# Patient Record
Sex: Male | Born: 1980 | Hispanic: No | Marital: Married | Smoking: Former smoker
Health system: Southern US, Community
[De-identification: ages and names within clinical notes are randomized; demographics above are authoritative.]

## PROBLEM LIST (undated history)

## (undated) HISTORY — PX: KNEE CARTILAGE SURGERY: SHX688

---

## 2008-05-29 ENCOUNTER — Encounter: Admission: RE | Admit: 2008-05-29 | Discharge: 2008-05-29 | Payer: Self-pay | Admitting: Internal Medicine

## 2019-05-05 DIAGNOSIS — Z03818 Encounter for observation for suspected exposure to other biological agents ruled out: Secondary | ICD-10-CM | POA: Diagnosis not present

## 2019-07-23 ENCOUNTER — Ambulatory Visit: Payer: Self-pay | Attending: Internal Medicine

## 2019-07-23 DIAGNOSIS — Z23 Encounter for immunization: Secondary | ICD-10-CM

## 2019-07-23 NOTE — Progress Notes (Signed)
   Covid-19 Vaccination Clinic  Name:  Garrett Andrews    MRN: [id number] DOB: [birth year]  07/23/2019  Mr. Eltringham was observed post Covid-19 immunization for 15 minutes without incident. He was provided with Vaccine Information Sheet and instruction to access the V-Safe system.   Mr. Kimmons was instructed to call 911 with any severe reactions post vaccine: Marland Kitchen Difficulty breathing  . Swelling of face and throat  . A fast heartbeat  . A bad rash all over body  . Dizziness and weakness   Immunizations Administered    Name Date Dose VIS Date Route   Pfizer COVID-19 Vaccine 07/23/2019  1:58 PM 0.3 mL 04/17/2019 Intramuscular   Manufacturer: [employer], [employer]   Lot: [id number]   NDC: [address]-0

## 2019-08-18 ENCOUNTER — Ambulatory Visit: Payer: Self-pay | Attending: Internal Medicine

## 2019-08-18 DIAGNOSIS — Z23 Encounter for immunization: Secondary | ICD-10-CM

## 2019-08-18 NOTE — Progress Notes (Signed)
   Covid-19 Vaccination Clinic  Name:  Patric Buckhalter    MRN: [id number] DOB: Aug 06, 1980  08/18/2019  Mr. Shams was observed post Covid-19 immunization for 15 minutes without incident. He was provided with Vaccine Information Sheet and instruction to access the V-Safe system.   Mr. Saraceni was instructed to call 911 with any severe reactions post vaccine: Marland Kitchen Difficulty breathing  . Swelling of face and throat  . A fast heartbeat  . A bad rash all over body  . Dizziness and weakness   Immunizations Administered    Name Date Dose VIS Date Route   Pfizer COVID-19 Vaccine 08/18/2019  8:49 AM 0.3 mL 04/17/2019 Intramuscular   Manufacturer: [employer], [employer]   Lot: [id number]   NDC: [address]-3

## 2019-10-19 DIAGNOSIS — Z0000 Encounter for general adult medical examination without abnormal findings: Secondary | ICD-10-CM | POA: Insufficient documentation

## 2019-10-19 DIAGNOSIS — E786 Lipoprotein deficiency: Secondary | ICD-10-CM | POA: Diagnosis not present

## 2019-10-19 DIAGNOSIS — R197 Diarrhea, unspecified: Secondary | ICD-10-CM | POA: Insufficient documentation

## 2019-10-19 DIAGNOSIS — Z1331 Encounter for screening for depression: Secondary | ICD-10-CM | POA: Diagnosis not present

## 2019-10-29 DIAGNOSIS — E786 Lipoprotein deficiency: Secondary | ICD-10-CM | POA: Diagnosis not present

## 2019-10-29 DIAGNOSIS — Z0000 Encounter for general adult medical examination without abnormal findings: Secondary | ICD-10-CM | POA: Diagnosis not present

## 2019-11-18 DIAGNOSIS — H52203 Unspecified astigmatism, bilateral: Secondary | ICD-10-CM | POA: Diagnosis not present

## 2019-11-18 DIAGNOSIS — H40013 Open angle with borderline findings, low risk, bilateral: Secondary | ICD-10-CM | POA: Diagnosis not present

## 2020-01-28 DIAGNOSIS — M25551 Pain in right hip: Secondary | ICD-10-CM | POA: Diagnosis not present

## 2020-02-12 DIAGNOSIS — M6281 Muscle weakness (generalized): Secondary | ICD-10-CM | POA: Diagnosis not present

## 2020-02-12 DIAGNOSIS — S76011D Strain of muscle, fascia and tendon of right hip, subsequent encounter: Secondary | ICD-10-CM | POA: Diagnosis not present

## 2020-05-04 ENCOUNTER — Emergency Department (HOSPITAL_COMMUNITY)
Admission: EM | Admit: 2020-05-04 | Discharge: 2020-05-04 | Disposition: A | Discharge status: Home / Self Care | Payer: BC Managed Care – PPO | Attending: Emergency Medicine | Admitting: Emergency Medicine

## 2020-05-04 ENCOUNTER — Emergency Department (HOSPITAL_COMMUNITY): Payer: BC Managed Care – PPO

## 2020-05-04 ENCOUNTER — Encounter (HOSPITAL_COMMUNITY): Payer: Self-pay | Admitting: Emergency Medicine

## 2020-05-04 DIAGNOSIS — Z5321 Procedure and treatment not carried out due to patient leaving prior to being seen by health care provider: Secondary | ICD-10-CM | POA: Diagnosis not present

## 2020-05-04 DIAGNOSIS — G459 Transient cerebral ischemic attack, unspecified: Secondary | ICD-10-CM | POA: Diagnosis not present

## 2020-05-04 DIAGNOSIS — R531 Weakness: Secondary | ICD-10-CM | POA: Insufficient documentation

## 2020-05-04 DIAGNOSIS — R202 Paresthesia of skin: Secondary | ICD-10-CM | POA: Insufficient documentation

## 2020-05-04 LAB — COMPREHENSIVE METABOLIC PANEL
ALT: 28 U/L (ref 0–44)
AST: 23 U/L (ref 15–41)
Albumin: 4.4 g/dL (ref 3.5–5.0)
Alkaline Phosphatase: 65 U/L (ref 38–126)
Anion gap: 10 (ref 5–15)
BUN: 11 mg/dL (ref 6–20)
CO2: 26 mmol/L (ref 22–32)
Calcium: 9.6 mg/dL (ref 8.9–10.3)
Chloride: 103 mmol/L (ref 98–111)
Creatinine, Ser: 1.16 mg/dL (ref 0.61–1.24)
GFR, Estimated: >60 mL/min (ref >60)
Glucose, Bld: 110 mg/dL — ABNORMAL HIGH (ref 70–99)
Potassium: 3.9 mmol/L (ref 3.5–5.1)
Sodium: 139 mmol/L (ref 135–145)
Total Bilirubin: 0.6 mg/dL (ref 0.3–1.2)
Total Protein: 6.9 g/dL (ref 6.5–8.1)

## 2020-05-04 LAB — CBC
HCT: 44.2 % (ref 39.0–52.0)
Hemoglobin: 14.7 g/dL (ref 13.0–17.0)
MCH: 27.4 pg (ref 26.0–34.0)
MCHC: 33.3 g/dL (ref 30.0–36.0)
MCV: 82.5 fL (ref 80.0–100.0)
Platelets: 249 10*3/uL (ref 150–400)
RBC: 5.36 MIL/uL (ref 4.22–5.81)
RDW: 13 % (ref 11.5–15.5)
WBC: 7.8 10*3/uL (ref 4.0–10.5)
nRBC: 0 % (ref 0.0–0.2)

## 2020-05-04 NOTE — ED Notes (Signed)
Called patient to re check vitals patient didn't answer will try later

## 2020-05-04 NOTE — ED Notes (Signed)
Called pat inside and outside x3 for vitals, no answer

## 2020-05-04 NOTE — ED Notes (Addendum)
Pt called for vitals with no response x2 °

## 2020-05-04 NOTE — ED Triage Notes (Signed)
Pt here from home with c/o right side numbness and feeling slight tingling and weakness , all of which have got better ,

## 2020-05-09 DIAGNOSIS — R202 Paresthesia of skin: Secondary | ICD-10-CM | POA: Diagnosis not present

## 2020-05-09 DIAGNOSIS — G43009 Migraine without aura, not intractable, without status migrainosus: Secondary | ICD-10-CM | POA: Diagnosis not present

## 2020-05-11 ENCOUNTER — Other Ambulatory Visit: Payer: Self-pay | Admitting: Family Medicine

## 2020-05-11 ENCOUNTER — Ambulatory Visit
Admission: RE | Admit: 2020-05-11 | Discharge: 2020-05-11 | Disposition: A | Discharge status: Home / Self Care | Payer: BC Managed Care – PPO | Source: Ambulatory Visit | Attending: Family Medicine | Admitting: Family Medicine

## 2020-05-11 ENCOUNTER — Other Ambulatory Visit: Payer: Self-pay

## 2020-05-11 DIAGNOSIS — G43909 Migraine, unspecified, not intractable, without status migrainosus: Secondary | ICD-10-CM | POA: Diagnosis not present

## 2020-05-11 DIAGNOSIS — R110 Nausea: Secondary | ICD-10-CM | POA: Diagnosis not present

## 2020-05-11 DIAGNOSIS — J329 Chronic sinusitis, unspecified: Secondary | ICD-10-CM | POA: Diagnosis not present

## 2020-05-11 DIAGNOSIS — R202 Paresthesia of skin: Secondary | ICD-10-CM

## 2020-05-11 DIAGNOSIS — R200 Anesthesia of skin: Secondary | ICD-10-CM | POA: Diagnosis not present

## 2020-05-11 MED ORDER — GADOBENATE DIMEGLUMINE 529 MG/ML IV SOLN
20.0000 mL | Freq: Once | INTRAVENOUS | Status: AC | PRN
  Administered 2020-05-11: 20 mL via INTRAVENOUS

## 2020-05-12 ENCOUNTER — Encounter: Payer: Self-pay | Admitting: Neurology

## 2020-05-12 ENCOUNTER — Ambulatory Visit (INDEPENDENT_AMBULATORY_CARE_PROVIDER_SITE_OTHER): Payer: BC Managed Care – PPO | Admitting: Neurology

## 2020-05-12 DIAGNOSIS — G43419 Hemiplegic migraine, intractable, without status migrainosus: Secondary | ICD-10-CM

## 2020-05-12 DIAGNOSIS — G43409 Hemiplegic migraine, not intractable, without status migrainosus: Secondary | ICD-10-CM

## 2020-05-12 HISTORY — DX: Hemiplegic migraine, not intractable, without status migrainosus: G43.409

## 2020-05-12 MED ORDER — TOPIRAMATE 25 MG PO TABS: ORAL_TABLET | ORAL | 3 refills | Status: DC

## 2020-05-12 NOTE — Progress Notes (Signed)
Reason for visit: Hemiplegic migraine  Referring physician: Dr. Arlyss Gandy is a 40 y.o. male  History of present illness:  Garrett Andrews is a 40 year old right-handed male with a history of migraine headaches since he was a child.  His headaches generally are in the fronto-temporal regions bilaterally associated with some photophobia and phonophobia and some nausea and vomiting.  He has noted that if he treats the headache quickly with Excedrin Migraine he is able to abort the headache on most occasions.  He typically gets 1-2 headaches a month and functions fairly well.  Recently, he has had a significant increase in the number of headaches, he has had about 10 headaches over the last 1 month.  At least 2 of the headaches have been associated with strokelike symptoms.  He went to the emergency room on 04 May 2020 with onset of right arm and hand numbness and weakness, then he developed numbness in the tongue and eventually developed an aphasia syndrome.  The weakness and numbness lasted about 15 to 20 minutes but the aphasia lasted about an hour.  He went to the hospital for an evaluation, CT scan of the brain was done and was unremarkable.  The patient developed a headache after the sensory and motor symptoms resolved, he had nausea and vomiting with the headache.  He reported no visual symptoms.  On 07 May 2020, he had another episode of numbness that started in the right leg and then went to the right arm, this lasted 15 to 20 minutes and then resolved with the headache that followed.  He had nausea and vomiting with this.  The patient has been set up for an MRI of the brain that was normal.  He comes to the office today for an evaluation.  He indicates that poor posture, lack of sleep, and certain odors will bring on his headache.  He indicates that his sister and his father have a history of migraine.  Past Medical History:  Diagnosis Date  . 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 KNEE CARTILAGE SURGERY Left     Family History  Problem Relation Age of Onset  . Diabetes Mother   . Diabetes Father   . Migraines Father   . Migraines Sister     Social history:  reports that he quit smoking about 17 years ago. He has never used smokeless tobacco. He reports current alcohol use. He reports that he does not use drugs.  Medications:  Prior to Admission medications   Medication Sig Start Date End Date Taking? Authorizing Provider  aspirin-acetaminophen-caffeine (EXCEDRIN MIGRAINE) [phone] MG tablet Take by mouth every 6 (six) hours as needed for headache.   Yes [provider]      Allergies  Allergen Reactions  . Chenopodium Quinoa     Quinoa rice allergy    ROS:  Out of a complete 14 system review of symptoms, the patient complains only of the following symptoms, and all other reviewed systems are negative.  Migraine headache  Blood pressure 128/83, pulse 69, height 6\' 1"  (1.854 m), weight 200 lb (90.7 kg).  Physical Exam  General: The patient is alert and cooperative at the time of the examination.  Eyes: Pupils are equal, round, and reactive to light. Discs are flat bilaterally.  Neck: The neck is supple, no carotid bruits are noted.  Respiratory: The respiratory examination is clear.  Cardiovascular: The cardiovascular examination reveals a  regular rate and rhythm, no obvious murmurs or rubs are noted.  Skin: Extremities are without significant edema.  Neurologic Exam  Mental status: The patient is alert and oriented x 3 at the time of the examination. The patient has apparent normal recent and remote memory, with an apparently normal attention span and concentration ability.  Cranial nerves: Facial symmetry is present. There is good sensation of the face to pinprick and soft touch bilaterally. The strength of the facial muscles and the muscles to head turning and shoulder  shrug are normal bilaterally. Speech is well enunciated, no aphasia or dysarthria is noted. Extraocular movements are full. Visual fields are full. The tongue is midline, and the patient has symmetric elevation of the soft palate. No obvious hearing deficits are noted.  Motor: The motor testing reveals 5 over 5 strength of all 4 extremities. Good symmetric motor tone is noted throughout.  Sensory: Sensory testing is intact to pinprick, soft touch, vibration sensation, and position sense on all 4 extremities. No evidence of extinction is noted.  Coordination: Cerebellar testing reveals good finger-nose-finger and heel-to-shin bilaterally.  Gait and station: Gait is normal. Tandem gait is normal. Romberg is negative. No drift is seen.  Reflexes: Deep tendon reflexes are symmetric and normal bilaterally. Toes are downgoing bilaterally.   MRI brain 05/11/20:  IMPRESSION: 1. Normal MRI of the brain. 2. Mild paranasal sinus mucosal disease.  * MRI scan images were reviewed online. I agree with the written report.    Assessment/Plan:  1.  Hemiplegic migraine  The patient is having strokelike symptoms associated with his migraine aura.  I would ask him to start an aspirin, 81 mg daily.  We will start him on Topamax working up to 75 mg at night.  He will call for any dose adjustments.  He can take the Excedrin Migraine if needed for the headache.  We should probably avoid any triptan medications in this patient.  He will follow up here in 3 months.  Marlan Palau MD 05/12/2020 7:10 PM  [facility] [address] [place], [place] [address]  Phone [phone] Fax [phone]

## 2020-05-12 NOTE — Patient Instructions (Signed)
We will start Topmax for headache prevention.  Topamax (topiramate) is a seizure medication that has an FDA approval for seizures and for migraine headache. Potential side effects of this medication include weight loss, cognitive slowing, tingling in the fingers and toes, and carbonated drinks will taste bad. If any significant side effects are noted on this drug, please contact our office.

## 2020-08-09 NOTE — Progress Notes (Signed)
PATIENT: Garrett Andrews DOB: June 14, 1980  REASON FOR VISIT: follow up HISTORY FROM: patient  HISTORY OF PRESENT ILLNESS: Today 08/10/20 Garrett Andrews is a 40 year old male with history of migraine headaches since he was a child.  In December 2021 and January 2022, had migraine with strokelike symptoms.  This was the first time this had occurred.  Symptoms were on the right side.  He eventually had MRI of the brain that was normal.  He was started on Topamax after initial consult with Dr. Anne Hahn in January, worked up to 75 mg at bedtime.  He has had no further migraines with strokelike symptoms.  Before the Topamax, on average having 1 migraine a week.  Since January, has only had 3 migraines.  He tolerates the Topamax well, only mild tingling side effects.  He continues to take Excedrin Migraine for 2 of the 3 headaches with good benefit.  He remains on aspirin 81 mg daily.  He identifies his triggers as neck pain from uncomfortable sleep position, delay in eating breakfast or lunch.  He lives with his wife, they have an almost 62-year-old son, he is doing well after being born premature at 62 weeks.  Here today for evaluation unaccompanied.  HISTORY 05/12/2020 Dr. Anne Hahn: Garrett Andrews is a 40 year old right-handed male with a history of migraine headaches since he was a child.  His headaches generally are in the fronto-temporal regions bilaterally associated with some photophobia and phonophobia and some nausea and vomiting.  He has noted that if he treats the headache quickly with Excedrin Migraine he is able to abort the headache on most occasions.  He typically gets 1-2 headaches a month and functions fairly well.  Recently, he has had a significant increase in the number of headaches, he has had about 10 headaches over the last 1 month.  At least 2 of the headaches have been associated with strokelike symptoms.  He went to the emergency room on 04 May 2020 with onset of right arm and hand numbness  and weakness, then he developed numbness in the tongue and eventually developed an aphasia syndrome.  The weakness and numbness lasted about 15 to 20 minutes but the aphasia lasted about an hour.  He went to the hospital for an evaluation, CT scan of the brain was done and was unremarkable.  The patient developed a headache after the sensory and motor symptoms resolved, he had nausea and vomiting with the headache.  He reported no visual symptoms.  On 07 May 2020, he had another episode of numbness that started in the right leg and then went to the right arm, this lasted 15 to 20 minutes and then resolved with the headache that followed.  He had nausea and vomiting with this.  The patient has been set up for an MRI of the brain that was normal.  He comes to the office today for an evaluation.  He indicates that poor posture, lack of sleep, and certain odors will bring on his headache.  He indicates that his sister and his father have a history of migraine.   REVIEW OF SYSTEMS: Out of a complete 14 system review of symptoms, the patient complains only of the following symptoms, and all other reviewed systems are negative.  Headache  ALLERGIES: Allergies  Allergen Reactions  . Chenopodium Quinoa     Quinoa rice allergy    HOME MEDICATIONS: Outpatient Medications Prior to Visit  Medication Sig Dispense Refill  . aspirin-acetaminophen-caffeine (EXCEDRIN MIGRAINE) [phone] MG tablet  Take by mouth every 6 (six) hours as needed for headache.    . topiramate (TOPAMAX) 25 MG tablet Take one tablet at night for one week, then take 2 tablets at night for one week, then take 3 tablets at night. 90 tablet 3   No facility-administered medications prior to visit.    PAST MEDICAL HISTORY: Past Medical History:  Diagnosis Date  . 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 KNEE CARTILAGE SURGERY Left     FAMILY HISTORY: Family  History  Problem Relation Age of Onset  . Diabetes Mother   . Diabetes Father   . Migraines Father   . Migraines Sister     SOCIAL HISTORY: Social History   Socioeconomic History  . Marital status: Married    Spouse name: Pryanka  . Number of children: Not on file  . Years of education: Not on file  . Highest education level: Not on file  Occupational History    Comment: full time  Tobacco Use  . Smoking status: Former Smoker    Quit date: 2005    Years since quitting: 17.2  . Smokeless tobacco: Never Used  Substance and Sexual Activity  . Alcohol use: Yes    Comment: socially  . Drug use: Never  . Sexual activity: Not on file  Other Topics Concern  . Not on file  Social History Narrative   Lives with wife and son   Right Handed   Drinks coffee and soda several times daily   Social Determinants of Health   Financial Resource Strain: Not on file  Food Insecurity: Not on file  Transportation Needs: Not on file  Physical Activity: Not on file  Stress: Not on file  Social Connections: Not on file  Intimate Partner Violence: Not on file   PHYSICAL EXAM  Vitals:   08/10/20 0900  BP: (!) 127/58  Pulse: 60  Weight: 197 lb (89.4 kg)  Height: 6\' 1"  (1.854 m)   Body mass index is 25.99 kg/m.  Generalized: Well developed, in no acute distress   Neurological examination  Mentation: Alert oriented to time, place, history taking. Follows all commands speech and language fluent Cranial nerve II-XII: Pupils were equal round reactive to light. Extraocular movements were full, visual field were full on confrontational test. Facial sensation and strength were normal. Uvula tongue midline. Head turning and shoulder shrug  were normal and symmetric. Motor: The motor testing reveals 5 over 5 strength of all 4 extremities. Good symmetric motor tone is noted throughout.  Sensory: Sensory testing is intact to soft touch on all 4 extremities. No evidence of extinction is noted.   Coordination: Cerebellar testing reveals good finger-nose-finger and heel-to-shin bilaterally.  Gait and station: Gait is normal.  Reflexes: Deep tendon reflexes are symmetric and normal bilaterally.   DIAGNOSTIC DATA (LABS, IMAGING, TESTING) - I reviewed patient records, labs, notes, testing and imaging myself where available.  Lab Results  Component Value Date   WBC 7.8 05/04/2020   HGB 14.7 05/04/2020   HCT 44.2 05/04/2020   MCV 82.5 05/04/2020   PLT 249 05/04/2020      Component Value Date/Time   NA 139 05/04/2020 0356   K 3.9 05/04/2020 0356   CL 103 05/04/2020 0356   CO2 26 05/04/2020 0356   GLUCOSE 110 (H) 05/04/2020 0356   BUN 11 05/04/2020 0356   CREATININE 1.16 05/04/2020 0356   CALCIUM 9.6 05/04/2020 0356  PROT 6.9 05/04/2020 0356   ALBUMIN 4.4 05/04/2020 0356   AST 23 05/04/2020 0356   ALT 28 05/04/2020 0356   ALKPHOS 65 05/04/2020 0356   BILITOT 0.6 05/04/2020 0356   GFRNONAA >60 05/04/2020 0356   No results found for: CHOL, HDL, LDLCALC, LDLDIRECT, TRIG, CHOLHDL No results found for: MLJQ4B No results found for: VITAMINB12 No results found for: TSH  ASSESSMENT AND PLAN 40 y.o. 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 here with:  1.  Hemiplegic migraine, chronic migraine headaches  -Continue Topamax 75 mg at bedtime for migraine prevention -Can continue Excedrin Migraine as needed for the headache -Since January, only 3 migraine headaches, none have been associated with strokelike symptoms -Continue aspirin 81 mg daily, due to history of strokelike symptoms associated with migraine aura -Will avoid triptan use, consider Bernita Raisin if needed -Call for any worsening headache, question, change in condition, otherwise follow-up in 8 months or sooner if needed  I spent 20 minutes of face-to-face and non-face-to-face time with patient.  This included previsit chart review, lab review, study review, order entry, electronic  health record documentation, patient education.  Margie Ege, AGNP-C, DNP 08/10/2020, 9:31 AM [facility] [address] [place], [place] [address] [phone]

## 2020-08-10 ENCOUNTER — Other Ambulatory Visit: Payer: Self-pay

## 2020-08-10 ENCOUNTER — Encounter: Payer: Self-pay | Admitting: Neurology

## 2020-08-10 ENCOUNTER — Ambulatory Visit (INDEPENDENT_AMBULATORY_CARE_PROVIDER_SITE_OTHER): Payer: BC Managed Care – PPO | Admitting: Neurology

## 2020-08-10 ENCOUNTER — Ambulatory Visit: Payer: BC Managed Care – PPO | Admitting: Neurology

## 2020-08-10 VITALS — BP 127/58 | HR 60 | Ht 73.0 in | Wt 197.0 lb

## 2020-08-10 DIAGNOSIS — G43419 Hemiplegic migraine, intractable, without status migrainosus: Secondary | ICD-10-CM

## 2020-08-10 MED ORDER — TOPIRAMATE 25 MG PO TABS: ORAL_TABLET | ORAL | 3 refills | Status: DC

## 2020-08-10 NOTE — Patient Instructions (Signed)
Continue Topamax for headache prevention  Stay on aspirin 81 mg daily Take Excedrin Migraine for headache  See you back in 8-9 months

## 2020-08-10 NOTE — Progress Notes (Signed)
I have read the note, and I agree with the clinical assessment and plan.  Barabara Motz K Shantese Raven   

## 2020-09-06 DIAGNOSIS — S60212A Contusion of left wrist, initial encounter: Secondary | ICD-10-CM | POA: Diagnosis not present

## 2020-09-06 DIAGNOSIS — M79641 Pain in right hand: Secondary | ICD-10-CM | POA: Diagnosis not present

## 2020-09-06 DIAGNOSIS — S63641A Sprain of metacarpophalangeal joint of right thumb, initial encounter: Secondary | ICD-10-CM | POA: Diagnosis not present

## 2020-09-06 DIAGNOSIS — M79642 Pain in left hand: Secondary | ICD-10-CM | POA: Diagnosis not present

## 2020-09-09 ENCOUNTER — Other Ambulatory Visit: Payer: Self-pay | Admitting: Orthopedic Surgery

## 2020-09-09 DIAGNOSIS — S63641A Sprain of metacarpophalangeal joint of right thumb, initial encounter: Secondary | ICD-10-CM

## 2020-09-14 DIAGNOSIS — U071 COVID-19: Secondary | ICD-10-CM | POA: Diagnosis not present

## 2020-10-10 DIAGNOSIS — R200 Anesthesia of skin: Secondary | ICD-10-CM | POA: Insufficient documentation

## 2020-10-10 DIAGNOSIS — S63641D Sprain of metacarpophalangeal joint of right thumb, subsequent encounter: Secondary | ICD-10-CM | POA: Diagnosis not present

## 2020-10-10 DIAGNOSIS — R209 Unspecified disturbances of skin sensation: Secondary | ICD-10-CM | POA: Insufficient documentation

## 2020-10-10 DIAGNOSIS — G43009 Migraine without aura, not intractable, without status migrainosus: Secondary | ICD-10-CM | POA: Insufficient documentation

## 2020-10-10 DIAGNOSIS — R112 Nausea with vomiting, unspecified: Secondary | ICD-10-CM | POA: Insufficient documentation

## 2020-10-10 DIAGNOSIS — S60212D Contusion of left wrist, subsequent encounter: Secondary | ICD-10-CM | POA: Diagnosis not present

## 2020-11-18 DIAGNOSIS — H52203 Unspecified astigmatism, bilateral: Secondary | ICD-10-CM | POA: Diagnosis not present

## 2020-11-18 DIAGNOSIS — R0789 Other chest pain: Secondary | ICD-10-CM | POA: Diagnosis not present

## 2020-11-18 DIAGNOSIS — H40013 Open angle with borderline findings, low risk, bilateral: Secondary | ICD-10-CM | POA: Diagnosis not present

## 2020-11-18 DIAGNOSIS — G43009 Migraine without aura, not intractable, without status migrainosus: Secondary | ICD-10-CM | POA: Diagnosis not present

## 2020-12-01 DIAGNOSIS — M25562 Pain in left knee: Secondary | ICD-10-CM | POA: Diagnosis not present

## 2020-12-16 DIAGNOSIS — E786 Lipoprotein deficiency: Secondary | ICD-10-CM | POA: Diagnosis not present

## 2020-12-22 ENCOUNTER — Other Ambulatory Visit: Payer: Self-pay | Admitting: Internal Medicine

## 2020-12-22 DIAGNOSIS — Z0000 Encounter for general adult medical examination without abnormal findings: Secondary | ICD-10-CM | POA: Diagnosis not present

## 2020-12-22 DIAGNOSIS — E786 Lipoprotein deficiency: Secondary | ICD-10-CM

## 2020-12-22 DIAGNOSIS — R0789 Other chest pain: Secondary | ICD-10-CM | POA: Diagnosis not present

## 2021-01-13 ENCOUNTER — Ambulatory Visit
Admission: RE | Admit: 2021-01-13 | Discharge: 2021-01-13 | Disposition: A | Discharge status: Home / Self Care | Payer: No Typology Code available for payment source | Source: Ambulatory Visit | Attending: Internal Medicine | Admitting: Internal Medicine

## 2021-01-13 DIAGNOSIS — E786 Lipoprotein deficiency: Secondary | ICD-10-CM

## 2021-04-12 ENCOUNTER — Encounter: Payer: Self-pay | Admitting: Neurology

## 2021-04-12 ENCOUNTER — Ambulatory Visit (INDEPENDENT_AMBULATORY_CARE_PROVIDER_SITE_OTHER): Payer: BC Managed Care – PPO | Admitting: Neurology

## 2021-04-12 VITALS — BP 125/78 | HR 67 | Ht 73.0 in | Wt 190.5 lb

## 2021-04-12 DIAGNOSIS — G43009 Migraine without aura, not intractable, without status migrainosus: Secondary | ICD-10-CM | POA: Diagnosis not present

## 2021-04-12 DIAGNOSIS — G43419 Hemiplegic migraine, intractable, without status migrainosus: Secondary | ICD-10-CM

## 2021-04-12 MED ORDER — TOPIRAMATE 25 MG PO TABS: ORAL_TABLET | ORAL | 3 refills | Status: DC

## 2021-04-12 NOTE — Patient Instructions (Signed)
Great to see you today! Continue current medications Call for any worsening symptoms See you back in 1 year

## 2021-04-12 NOTE — Progress Notes (Signed)
PATIENT: Garrett Andrews DOB: 01-28-1981  REASON FOR VISIT: follow up HISTORY FROM: patient Primary Neurologist: Dr. Anne Hahn, but will now be followed by Dr. Lucia Gaskins   HISTORY OF PRESENT ILLNESS: Today 04/12/21 [name] here today for follow-up with his wife and 40 year old son. Doing well on Topamax 75 mg at bedtime. Takes Excedrin migraine for acute headache. Having 1-2 headaches a month. Respond well to Excedrin. No further headaches with stroke like symptoms. Remains on aspirin 81 mg daily. Has chronic sinus issues. They just moved to [facility]. May consider establishing with a new neurologist in their area.   Update 08/10/20 SS: Garrett Andrews is a 40 year old male with history of migraine headaches since he was a child.  In December 2021 and January 2022, had migraine with strokelike symptoms.  This was the first time this had occurred.  Symptoms were on the right side.  He eventually had MRI of the brain that was normal.  He was started on Topamax after initial consult with Dr. Anne Hahn in January, worked up to 75 mg at bedtime.  He has had no further migraines with strokelike symptoms.  Before the Topamax, on average having 1 migraine a week.  Since January, has only had 3 migraines.  He tolerates the Topamax well, only mild tingling side effects.  He continues to take Excedrin Migraine for 2 of the 3 headaches with good benefit.  He remains on aspirin 81 mg daily.  He identifies his triggers as neck pain from uncomfortable sleep position, delay in eating breakfast or lunch.  He lives with his wife, they have an almost 40-year-old son, he is doing well after being born premature at 63 weeks.  Here today for evaluation unaccompanied.  HISTORY 05/12/2020 Dr. Anne Hahn: Garrett Andrews is a 40 year old right-handed male with a history of migraine headaches since he was a child.  His headaches generally are in the fronto-temporal regions bilaterally associated with some photophobia and phonophobia and some  nausea and vomiting.  He has noted that if he treats the headache quickly with Excedrin Migraine he is able to abort the headache on most occasions.  He typically gets 1-2 headaches a month and functions fairly well.  Recently, he has had a significant increase in the number of headaches, he has had about 10 headaches over the last 1 month.  At least 2 of the headaches have been associated with strokelike symptoms.  He went to the emergency room on 04 May 2020 with onset of right arm and hand numbness and weakness, then he developed numbness in the tongue and eventually developed an aphasia syndrome.  The weakness and numbness lasted about 15 to 20 minutes but the aphasia lasted about an hour.  He went to the hospital for an evaluation, CT scan of the brain was done and was unremarkable.  The patient developed a headache after the sensory and motor symptoms resolved, he had nausea and vomiting with the headache.  He reported no visual symptoms.  On 07 May 2020, he had another episode of numbness that started in the right leg and then went to the right arm, this lasted 15 to 20 minutes and then resolved with the headache that followed.  He had nausea and vomiting with this.  The patient has been set up for an MRI of the brain that was normal.  He comes to the office today for an evaluation.  He indicates that poor posture, lack of sleep, and certain odors will bring  on his headache.  He indicates that his sister and his father have a history of migraine.   REVIEW OF SYSTEMS: Out of a complete 14 system review of symptoms, the patient complains only of the following symptoms, and all other reviewed systems are negative.  Headache  ALLERGIES: Allergies  Allergen Reactions   Chenopodium Quinoa     Quinoa rice allergy    HOME MEDICATIONS: Outpatient Medications Prior to Visit  Medication Sig Dispense Refill   aspirin 81 MG EC tablet Take 81 mg by mouth daily. Swallow whole.      aspirin-acetaminophen-caffeine (EXCEDRIN MIGRAINE) 250-250-65 MG tablet Take by mouth every 6 (six) hours as needed for headache.     topiramate (TOPAMAX) 25 MG tablet Take 3 at night 270 tablet 3   No facility-administered medications prior to visit.    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    PAST SURGICAL HISTORY: Past Surgical History:  Procedure Laterality Date   KNEE CARTILAGE SURGERY Left     FAMILY HISTORY: Family History  Problem Relation Age of Onset   Diabetes Mother    Diabetes Father    Migraines Father    Migraines Sister     SOCIAL HISTORY: Social History   Socioeconomic History   Marital status: Married    Spouse name: Pryanka   Number of children: Not on file   Years of education: Not on file   Highest education level: Not on file  Occupational History    Comment: full time  Tobacco Use   Smoking status: Former    Types: Cigarettes    Quit date: 2005    Years since quitting: 17.9   Smokeless tobacco: Never  Substance and Sexual Activity   Alcohol use: Yes    Comment: socially   Drug use: Never   Sexual activity: Not on file  Other Topics Concern   Not on file  Social History Narrative   Lives with wife and son   Right Handed   Drinks coffee and soda several times daily   Social Determinants of Health   Financial Resource Strain: Not on file  Food Insecurity: Not on file  Transportation Needs: Not on file  Physical Activity: Not on file  Stress: Not on file  Social Connections: Not on file  Intimate Partner Violence: Not on file   PHYSICAL EXAM  Vitals:   04/12/21 0846  BP: 125/78  Pulse: 67  Weight: 190 lb 8 oz (86.4 kg)  Height: 6\' 1"  (1.854 m)    Body mass index is 25.13 kg/m.  Generalized: Well developed, in no acute distress   Neurological examination  Mentation: Alert oriented to time, place, history taking. Follows all commands speech and language  fluent Cranial nerve II-XII: Pupils were equal round reactive to light. Extraocular movements were full, visual field were full on confrontational test. Facial sensation and strength were normal. Head turning and shoulder shrug  were normal and symmetric. Motor: The motor testing reveals 5 over 5 strength of all 4 extremities. Good symmetric motor tone is noted throughout.  Sensory: Sensory testing is intact to soft touch on all 4 extremities. No evidence of extinction is noted.  Coordination: Cerebellar testing reveals good finger-nose-finger and heel-to-shin bilaterally.  Gait and station: Gait is normal. Tandem gait is intact.  Reflexes: Deep tendon reflexes are symmetric and normal bilaterally.   DIAGNOSTIC DATA (LABS, IMAGING, TESTING) - I reviewed patient records, labs, notes, testing and imaging myself  where available.  Lab Results  Component Value Date   WBC 7.8 05/04/2020   HGB 14.7 05/04/2020   HCT 44.2 05/04/2020   MCV 82.5 05/04/2020   PLT 249 05/04/2020      Component Value Date/Time   NA 139 05/04/2020 0356   K 3.9 05/04/2020 0356   CL 103 05/04/2020 0356   CO2 26 05/04/2020 0356   GLUCOSE 110 (H) 05/04/2020 0356   BUN 11 05/04/2020 0356   CREATININE 1.16 05/04/2020 0356   CALCIUM 9.6 05/04/2020 0356   PROT 6.9 05/04/2020 0356   ALBUMIN 4.4 05/04/2020 0356   AST 23 05/04/2020 0356   ALT 28 05/04/2020 0356   ALKPHOS 65 05/04/2020 0356   BILITOT 0.6 05/04/2020 0356   GFRNONAA >60 05/04/2020 0356   No results found for: CHOL, HDL, LDLCALC, LDLDIRECT, TRIG, CHOLHDL No results found for: ANVB1Y No results found for: VITAMINB12 No results found for: TSH  ASSESSMENT AND PLAN 40 y.o. 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 here with:  1.  Hemiplegic migraine, chronic migraine headaches  -Doing great, no recurrent strokelike headache, on average 1-2 headaches a month  -Continue Topamax 75 mg at bedtime for migraine  prevention -Can continue Excedrin Migraine as needed for the headache -Continue aspirin 81 mg daily, due to history of strokelike symptoms associated with migraine aura -We will follow-up in 1 year or sooner if needed, call for worsening headache, they have moved to [facility] area, I will be happy to place a referral to a neurologist in the area, they will let me know -Dr. Lucia Gaskins will be the primary neurologist since Dr. Anne Hahn has retired  Margie Ege, [place], DNP 04/12/2021, 9:30 AM [facility] [address] [place], [place] [address] [phone]

## 2021-06-10 IMAGING — MR MR HEAD WO/W CM
12 series · 48 of 48 positions shown · IV contrast (20 ML MULTIHANCE)
Comparison: Head CT May 04, 2020.

CLINICAL DATA: Right-sided numbness, migraines and nausea.

EXAM:
MRI HEAD WITHOUT AND WITH CONTRAST
TECHNIQUE: Multiplanar, multiecho pulse sequences of the brain and surrounding
structures were obtained without and with intravenous contrast.
CONTRAST:  20mL MULTIHANCE GADOBENATE DIMEGLUMINE 529 MG/ML IV SOLN

[Series 2: t1_se_sag · sagittal · 5.0mm · 0.45mm/px · 1 of 21 slices shown]
[im 1/21]
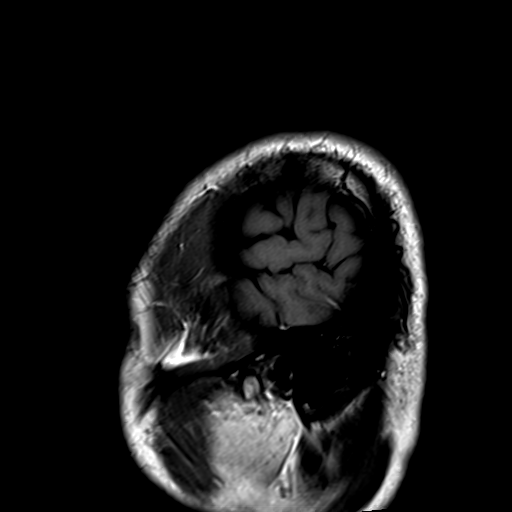

[Series 3: ep2d_diff_3 · axial · 3.0mm · 1.80mm/px · z∈[-75,+66]mm · 6 of 93 slices shown]
[im 1/93]
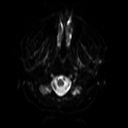
[im 19/93]
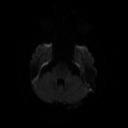
[im 37/93]
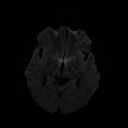
[im 56/93]
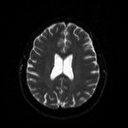
[im 74/93]
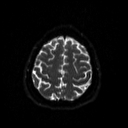
[im 93/93]
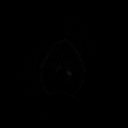

[Series 4: ep2d_diff_3_adc · axial · 3.0mm · 1.80mm/px · z∈[-75,+66]mm · 3 of 47 slices shown]
[im 1/47]
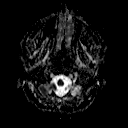
[im 24/47]
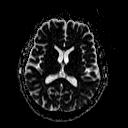
[im 47/47]
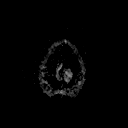

[Series 5: ep2d_diff_cor · coronal · 5.0mm · 1.77mm/px · 3 of 47 slices shown]
[im 1/47]
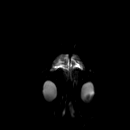
[im 24/47]
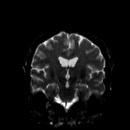
[im 47/47]
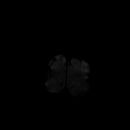

[Series 6: ep2d_diff_cor_adc · coronal · 5.0mm · 1.77mm/px · 2 of 24 slices shown]
[im 1/24]
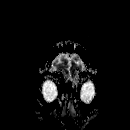
[im 24/24]
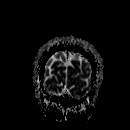

[Series 8: swi_images · axial · 2.0mm · 0.90mm/px · z∈[-84,+74]mm · 5 of 80 slices shown]
[im 1/80]
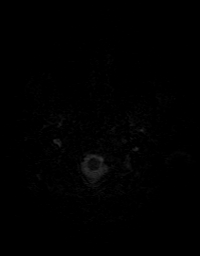
[im 20/80]
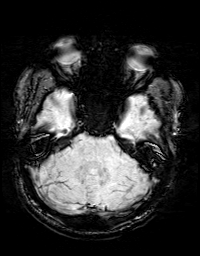
[im 40/80]
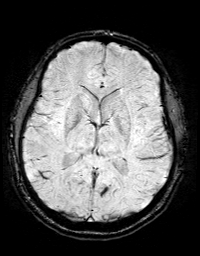
[im 60/80]
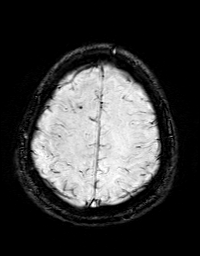
[im 80/80]
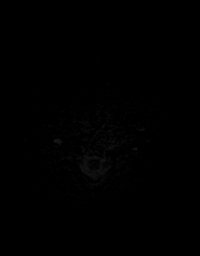

[Series 9: FLAIR · axial · 3.0mm · 0.43mm/px · z∈[-83,+73]mm · 2 of 27 slices shown]
[im 1/27]
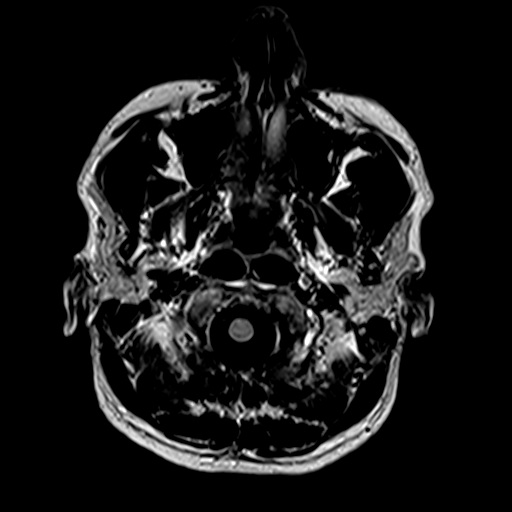
[im 27/27]
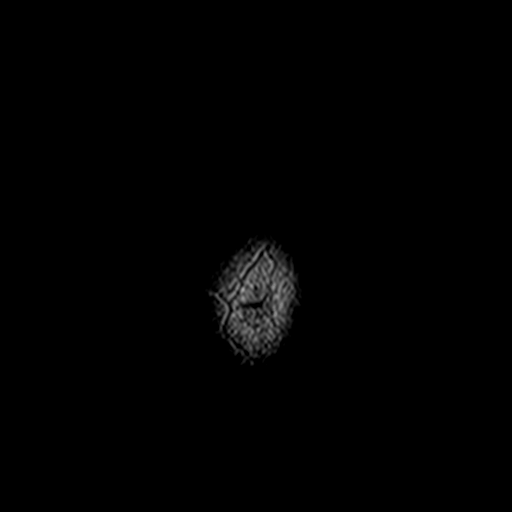

[Series 10: t2_tse_tra_512 · axial · 5.0mm · 0.60mm/px · z∈[-74,+64]mm · 2 of 24 slices shown]
[im 1/24]
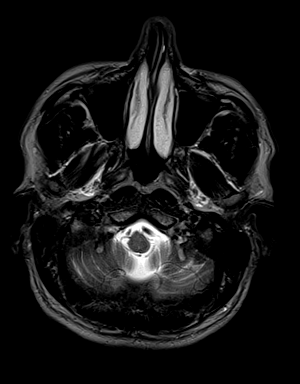
[im 24/24]
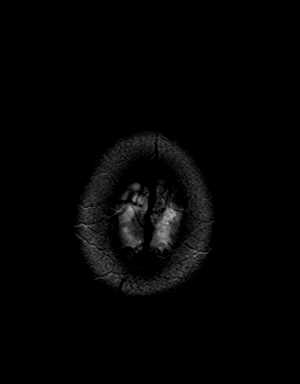

[Series 11: t1_mpr_tra · axial · 1.0mm · 0.72mm/px · z∈[-85,+74]mm · 10 of 160 slices shown]
[im 1/160]
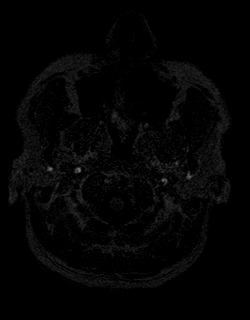
[im 18/160]
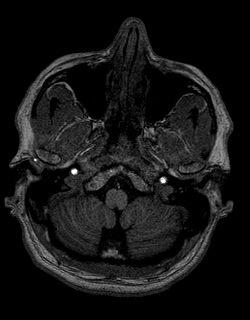
[im 36/160]
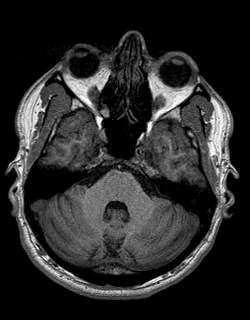
[im 54/160]
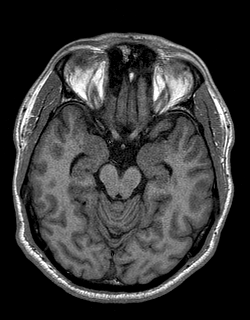
[im 71/160]
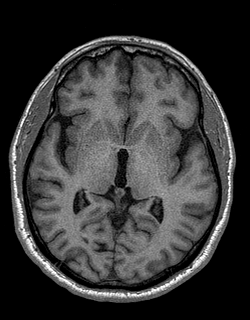
[im 89/160]
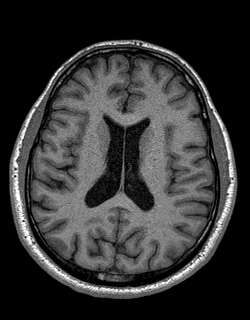
[im 107/160]
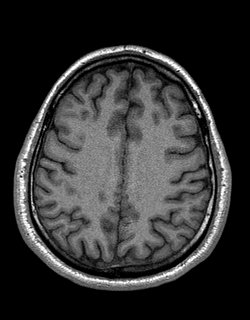
[im 124/160]
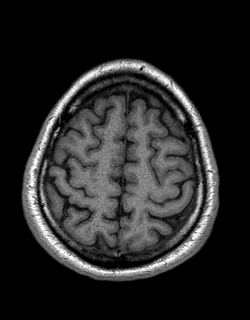
[im 142/160]
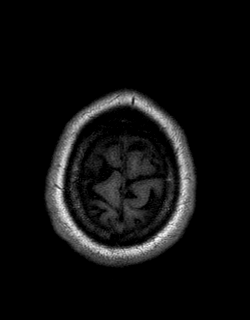
[im 160/160]
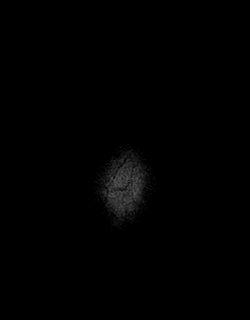

[Series 12: T2 · coronal · 5.0mm · 0.45mm/px · 2 of 26 slices shown]
[im 1/26]
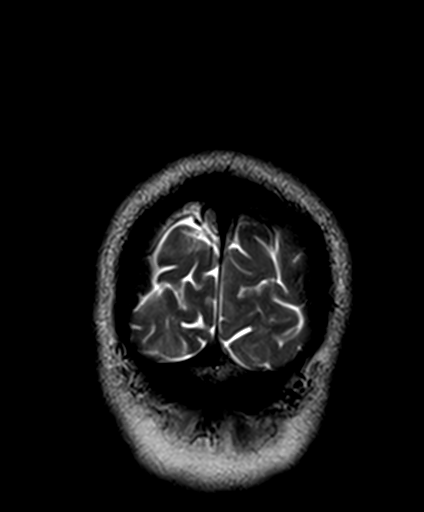
[im 26/26]
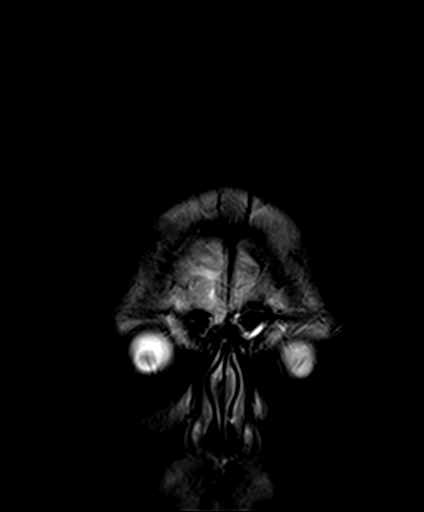

[Series 13: T1 post-contrast · coronal · 5.0mm · 0.72mm/px · 2 of 26 slices shown]
[im 1/26]
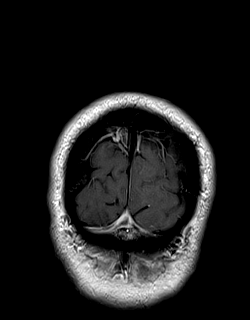
[im 26/26]
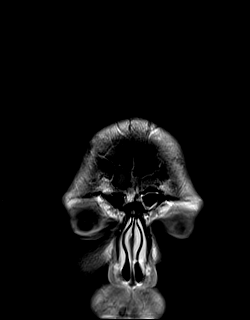

[Series 14: post t1_mpr_tra · axial · 1.0mm · 0.72mm/px · z∈[-85,+74]mm · 10 of 160 slices shown]
[im 1/160]
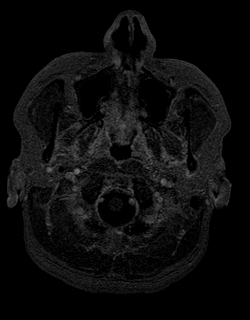
[im 18/160]
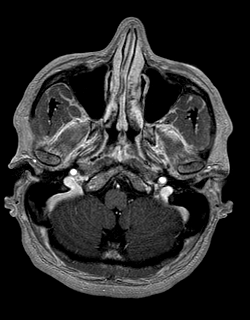
[im 36/160]
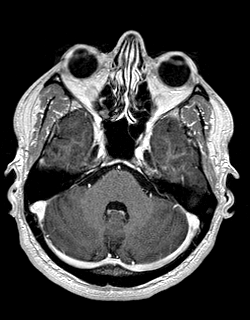
[im 54/160]
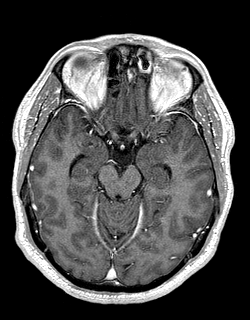
[im 71/160]
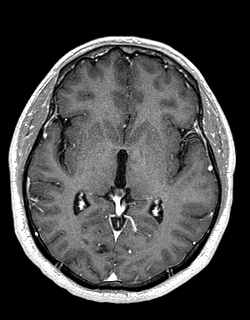
[im 89/160]
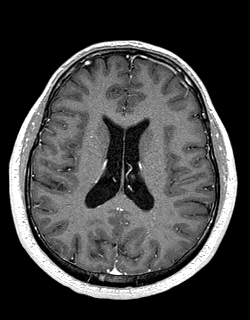
[im 107/160]
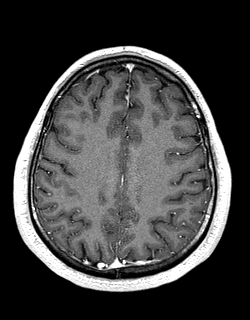
[im 124/160]
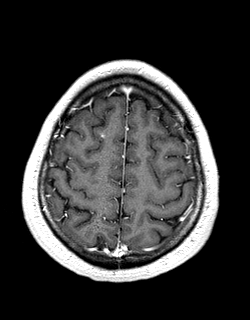
[im 142/160]
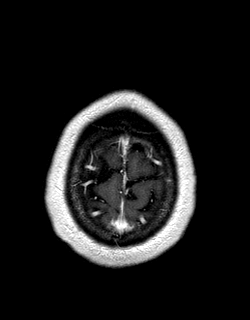
[im 160/160]
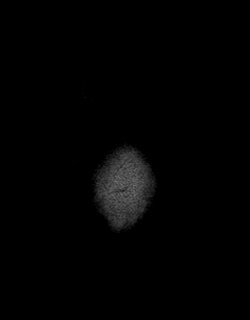

[48 of 48 positions shown; findings below may reference images not displayed]

FINDINGS: Brain: No acute infarction, hemorrhage, hydrocephalus, extra-axial
collection or mass lesion. The brain parenchyma has normal
morphology and signal characteristics. No focus of abnormal contrast
enhancement.

Vascular: Normal flow voids.

Skull and upper cervical spine: Normal marrow signal.

Sinuses/Orbits: Scattered mild mucosal thickening throughout the
paranasal sinuses. The orbits are maintained.
IMPRESSION: 1. Normal MRI of the brain.
2. Mild paranasal sinus mucosal disease.

## 2021-06-19 DIAGNOSIS — Z6825 Body mass index (BMI) 25.0-25.9, adult: Secondary | ICD-10-CM | POA: Diagnosis not present

## 2021-06-19 DIAGNOSIS — G43409 Hemiplegic migraine, not intractable, without status migrainosus: Secondary | ICD-10-CM | POA: Diagnosis not present

## 2021-06-19 DIAGNOSIS — G43009 Migraine without aura, not intractable, without status migrainosus: Secondary | ICD-10-CM | POA: Diagnosis not present

## 2021-06-19 DIAGNOSIS — E786 Lipoprotein deficiency: Secondary | ICD-10-CM | POA: Diagnosis not present

## 2021-07-11 DIAGNOSIS — M25519 Pain in unspecified shoulder: Secondary | ICD-10-CM | POA: Diagnosis not present

## 2021-07-11 DIAGNOSIS — G43409 Hemiplegic migraine, not intractable, without status migrainosus: Secondary | ICD-10-CM | POA: Diagnosis not present

## 2021-07-11 DIAGNOSIS — R29898 Other symptoms and signs involving the musculoskeletal system: Secondary | ICD-10-CM | POA: Diagnosis not present

## 2021-07-11 DIAGNOSIS — R42 Dizziness and giddiness: Secondary | ICD-10-CM | POA: Diagnosis not present

## 2021-07-25 DIAGNOSIS — Z8739 Personal history of other diseases of the musculoskeletal system and connective tissue: Secondary | ICD-10-CM | POA: Diagnosis not present

## 2021-07-25 DIAGNOSIS — R531 Weakness: Secondary | ICD-10-CM | POA: Diagnosis not present

## 2021-07-25 DIAGNOSIS — R29898 Other symptoms and signs involving the musculoskeletal system: Secondary | ICD-10-CM | POA: Diagnosis not present

## 2021-07-25 DIAGNOSIS — R42 Dizziness and giddiness: Secondary | ICD-10-CM | POA: Diagnosis not present

## 2021-07-25 DIAGNOSIS — R202 Paresthesia of skin: Secondary | ICD-10-CM | POA: Diagnosis not present

## 2021-08-31 DIAGNOSIS — G43909 Migraine, unspecified, not intractable, without status migrainosus: Secondary | ICD-10-CM | POA: Diagnosis not present

## 2021-08-31 DIAGNOSIS — G8190 Hemiplegia, unspecified affecting unspecified side: Secondary | ICD-10-CM | POA: Diagnosis not present

## 2021-08-31 DIAGNOSIS — R42 Dizziness and giddiness: Secondary | ICD-10-CM | POA: Diagnosis not present

## 2021-09-14 DIAGNOSIS — M6281 Muscle weakness (generalized): Secondary | ICD-10-CM | POA: Diagnosis not present

## 2021-09-14 DIAGNOSIS — G8190 Hemiplegia, unspecified affecting unspecified side: Secondary | ICD-10-CM | POA: Diagnosis not present

## 2021-09-14 DIAGNOSIS — R42 Dizziness and giddiness: Secondary | ICD-10-CM | POA: Diagnosis not present

## 2022-04-12 ENCOUNTER — Ambulatory Visit: Payer: BC Managed Care – PPO | Admitting: Neurology

## 2022-04-24 ENCOUNTER — Encounter: Payer: Self-pay | Admitting: Neurology

## 2022-04-24 ENCOUNTER — Other Ambulatory Visit: Payer: Self-pay | Admitting: Neurology

## 2022-04-25 MED ORDER — TOPIRAMATE 25 MG PO TABS: ORAL_TABLET | ORAL | 0 refills | Status: DC

## 2022-04-25 NOTE — Addendum Note (Signed)
Addended by: Ann Maki on: 04/25/2022 09:26 AM   Modules accepted: Orders

## 2022-05-09 NOTE — Progress Notes (Unsigned)
PATIENT: Garrett Andrews DOB: 03/14/1981  REASON FOR VISIT: follow up HISTORY FROM: patient Primary Neurologist: Dr. Jannifer Andrews, but will now be followed by Dr. Jaynee Andrews   HISTORY OF PRESENT ILLNESS: Today 05/10/22 Saw neurologist in [place], MRI cervical spine was unremarkable, EMG for left arm was unremarkable. Had left arm weakness, noticed hard to pick up dumbbells, strength was not equal with both arms. He was having extended work commute 5 hours, he modified this, it resolved. He did reduce the Topamax to 50 mg at the same time. He did go back to 75 mg shortly after symptoms. He remains on aspirin. Headaches are primarily triggered by sleep position. 1-2 times a month needs Excedrin. He is doing well. He would like to reduce Topamax, see about stopping aspirin.  04/12/21 SS: Garrett Andrews here today for follow-up with his wife and 40 year old son. Doing well on Topamax 75 mg at bedtime. Takes Excedrin migraine for acute headache. Having 1-2 headaches a month. Respond well to Excedrin. No further headaches with stroke like symptoms. Remains on aspirin 81 mg daily. Has chronic sinus issues. They just moved to [facility]. May consider establishing with a new neurologist in their area.   Update 08/10/20 SS: Garrett Andrews is a 42 year old male with history of migraine headaches since he was a child.  In December 2021 and January 2022, had migraine with strokelike symptoms.  This was the first time this had occurred.  Symptoms were on the right side.  He eventually had MRI of the brain that was normal.  He was started on Topamax after initial consult with Dr. Jannifer Andrews in January, worked up to 75 mg at bedtime.  He has had no further migraines with strokelike symptoms.  Before the Topamax, on average having 1 migraine a week.  Since January, has only had 3 migraines.  He tolerates the Topamax well, only mild tingling side effects.  He continues to take Excedrin Migraine for 2 of the 3 headaches with good benefit.   He remains on aspirin 81 mg daily.  He identifies his triggers as neck pain from uncomfortable sleep position, delay in eating breakfast or lunch.  He lives with his wife, they have an almost 72-year-old son, he is doing well after being born premature at 79 weeks.  Here today for evaluation unaccompanied.  HISTORY 05/12/2020 Dr. Jannifer Andrews: Garrett Andrews is a 42 year old right-handed male with a history of migraine headaches since he was a child.  His headaches generally are in the fronto-temporal regions bilaterally associated with some photophobia and phonophobia and some nausea and vomiting.  He has noted that if he treats the headache quickly with Excedrin Migraine he is able to abort the headache on most occasions.  He typically gets 1-2 headaches a month and functions fairly well.  Recently, he has had a significant increase in the number of headaches, he has had about 10 headaches over the last 1 month.  At least 2 of the headaches have been associated with strokelike symptoms.  He went to the emergency room on 04 May 2020 with onset of right arm and hand numbness and weakness, then he developed numbness in the tongue and eventually developed an aphasia syndrome.  The weakness and numbness lasted about 15 to 20 minutes but the aphasia lasted about an hour.  He went to the hospital for an evaluation, CT scan of the brain was done and was unremarkable.  The patient developed a headache after the sensory and motor symptoms resolved,  he had nausea and vomiting with the headache.  He reported no visual symptoms.  On 07 May 2020, he had another episode of numbness that started in the right leg and then went to the right arm, this lasted 15 to 20 minutes and then resolved with the headache that followed.  He had nausea and vomiting with this.  The patient has been set up for an MRI of the brain that was normal.  He comes to the office today for an evaluation.  He indicates that poor posture, lack of sleep, and  certain odors will bring on his headache.  He indicates that his sister and his father have a history of migraine.   REVIEW OF SYSTEMS: Out of a complete 14 system review of symptoms, the patient complains only of the following symptoms, and all other reviewed systems are negative.  Headache  ALLERGIES: Allergies  Allergen Reactions   Chenopodium Quinoa     Quinoa rice allergy    HOME MEDICATIONS: Outpatient Medications Prior to Visit  Medication Sig Dispense Refill   aspirin 81 MG EC tablet Take 81 mg by mouth daily. Swallow whole.     aspirin-acetaminophen-caffeine (EXCEDRIN MIGRAINE) 250-250-65 MG tablet Take by mouth every 6 (six) hours as needed for headache.     topiramate (TOPAMAX) 25 MG tablet Take 3 at night 270 tablet 0   No facility-administered medications prior to visit.    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    PAST SURGICAL HISTORY: Past Surgical History:  Procedure Laterality Date   KNEE CARTILAGE SURGERY Left     FAMILY HISTORY: Family History  Problem Relation Age of Onset   Diabetes Mother    Diabetes Father    Migraines Father    Migraines Sister     SOCIAL HISTORY: Social History   Socioeconomic History   Marital status: Married    Spouse name: Garrett Andrews   Number of children: Not on file   Years of education: Not on file   Highest education level: Not on file  Occupational History    Comment: full time  Tobacco Use   Smoking status: Former    Types: Cigarettes    Quit date: 2005    Years since quitting: 19.0   Smokeless tobacco: Never  Substance and Sexual Activity   Alcohol use: Yes    Comment: socially   Drug use: Never   Sexual activity: Not on file  Other Topics Concern   Not on file  Social History Narrative   Lives with wife and son   Right Handed   Drinks coffee and soda several times daily   Social Determinants of Health   Financial Resource Strain: Not on file   Food Insecurity: Not on file  Transportation Needs: Not on file  Physical Activity: Not on file  Stress: Not on file  Social Connections: Not on file  Intimate Partner Violence: Not on file   PHYSICAL EXAM  Vitals:   05/10/22 1405  BP: 129/77  Pulse: (!) 55  Weight: 198 lb (89.8 kg)  Height: 6\' 1"  (1.854 m)   Body mass index is 26.12 kg/m.  Generalized: Well developed, in no acute distress   Neurological examination  Mentation: Alert oriented to time, place, history taking. Follows all commands speech and language fluent Cranial nerve II-XII: Pupils were equal round reactive to light. Extraocular movements were full, visual field were full on confrontational test. Facial sensation and strength were normal.  Head turning and shoulder shrug  were normal and symmetric. Motor: The motor testing reveals 5 over 5 strength of all 4 extremities. Good symmetric motor tone is noted throughout.  Sensory: Sensory testing is intact to soft touch on all 4 extremities. No evidence of extinction is noted.  Coordination: Cerebellar testing reveals good finger-nose-finger and heel-to-shin bilaterally.  Gait and station: Gait is normal.  Reflexes: Deep tendon reflexes are symmetric and normal bilaterally.   DIAGNOSTIC DATA (LABS, IMAGING, TESTING) - I reviewed patient records, labs, notes, testing and imaging myself where available.  Lab Results  Component Value Date   WBC 7.8 05/04/2020   HGB 14.7 05/04/2020   HCT 44.2 05/04/2020   MCV 82.5 05/04/2020   PLT 249 05/04/2020      Component Value Date/Time   NA 139 05/04/2020 0356   K 3.9 05/04/2020 0356   CL 103 05/04/2020 0356   CO2 26 05/04/2020 0356   GLUCOSE 110 (H) 05/04/2020 0356   BUN 11 05/04/2020 0356   CREATININE 1.16 05/04/2020 0356   CALCIUM 9.6 05/04/2020 0356   PROT 6.9 05/04/2020 0356   ALBUMIN 4.4 05/04/2020 0356   AST 23 05/04/2020 0356   ALT 28 05/04/2020 0356   ALKPHOS 65 05/04/2020 0356   BILITOT 0.6 05/04/2020  0356   GFRNONAA >60 05/04/2020 0356   No results found for: "CHOL", "HDL", "LDLCALC", "LDLDIRECT", "TRIG", "CHOLHDL" No results found for: "HGBA1C" No results found for: "VITAMINB12" No results found for: "TSH"  ASSESSMENT AND PLAN 42 y.o. 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 here with:  1.  Hemiplegic migraine, chronic migraine headaches  -Doing very well, would like to try to reduce Topamax, currently on 75 mg daily, will reduce to 50 mg for 6 weeks, continue to reduce to 25 for several weeks if he does well, monitor symptoms -Will remain on aspirin 81 mg daily due to history of strokelike symptoms associated with migraine aura -He will see Dr. Jaynee Andrews in 6 months virtually to establish care   Butler Denmark, AGNP-C, DNP 05/10/2022, 2:16 PM [facility] [address] [place], [place] [address] [phone]

## 2022-05-10 ENCOUNTER — Ambulatory Visit (INDEPENDENT_AMBULATORY_CARE_PROVIDER_SITE_OTHER): Payer: BC Managed Care – PPO | Admitting: Neurology

## 2022-05-10 ENCOUNTER — Encounter: Payer: Self-pay | Admitting: Neurology

## 2022-05-10 VITALS — BP 129/77 | HR 55 | Ht 73.0 in | Wt 198.0 lb

## 2022-05-10 DIAGNOSIS — G43419 Hemiplegic migraine, intractable, without status migrainosus: Secondary | ICD-10-CM

## 2022-05-10 DIAGNOSIS — G43009 Migraine without aura, not intractable, without status migrainosus: Secondary | ICD-10-CM | POA: Diagnosis not present

## 2022-05-10 MED ORDER — TOPIRAMATE 25 MG PO TABS: ORAL_TABLET | ORAL | 3 refills | Status: DC

## 2022-05-10 NOTE — Patient Instructions (Signed)
You can reduce the Topamax to 50 mg, stay at this dose for 6 weeks, monitor for worsening headaches, may continue to reduce slowly Continue the aspirin 81 mg daily Return in 6 months virtually with Dr. Jaynee Eagles

## 2022-07-03 ENCOUNTER — Ambulatory Visit: Payer: BC Managed Care – PPO | Admitting: Neurology

## 2022-09-03 ENCOUNTER — Telehealth: Payer: Self-pay | Admitting: Neurology

## 2022-09-03 NOTE — Telephone Encounter (Signed)
Sent mychart msg informing pt of appt change due to provider template change 

## 2022-11-12 ENCOUNTER — Telehealth: Payer: BC Managed Care – PPO | Admitting: Neurology

## 2022-11-14 ENCOUNTER — Telehealth (INDEPENDENT_AMBULATORY_CARE_PROVIDER_SITE_OTHER): Payer: BC Managed Care – PPO | Admitting: Neurology

## 2022-11-14 ENCOUNTER — Telehealth: Payer: Self-pay

## 2022-11-14 DIAGNOSIS — G43419 Hemiplegic migraine, intractable, without status migrainosus: Secondary | ICD-10-CM

## 2022-11-14 DIAGNOSIS — G43009 Migraine without aura, not intractable, without status migrainosus: Secondary | ICD-10-CM

## 2022-11-14 MED ORDER — RIZATRIPTAN BENZOATE 10 MG PO TBDP: 10.0000 mg | ORAL_TABLET | ORAL | 11 refills | Status: DC | PRN

## 2022-11-14 NOTE — Progress Notes (Signed)
PATIENT: Garrett Andrews DOB: Feb 15, 1981  REASON FOR VISIT: follow up HISTORY FROM: patient Primary Neurologist: Dr. Anne Hahn, but will now be followed by Dr. Lucia Gaskins   Virtual Visit via Video Note  I connected with Garrett Andrews on 11/18/22 at  9:30 AM EDT by a video enabled telemedicine application and verified that I am speaking with the correct person using two identifiers.  Location: Patient: home Provider: office   I discussed the limitations of evaluation and management by telemedicine and the availability of in person appointments. The patient expressed understanding and agreed to proceed.   Follow Up Instructions:    I discussed the assessment and treatment plan with the patient. The patient was provided an opportunity to ask questions and all were answered. The patient agreed with the plan and demonstrated an understanding of the instructions.   The patient was advised to call back or seek an in-person evaluation if the symptoms worsen or if the condition fails to improve as anticipated.  I provided over 10 minutes of non-face-to-face time during this encounter.   Garrett Fret, MD   HISTORY OF PRESENT ILLNESS:  11/14/2022: First time seeing him, he has been seeing my colleagues Dr. Anne Hahn and Margie Ege Dr.  He has some beck pain and goes to chiropractor, told him I will add that in the AVS, only having 1 migraine a month without the hemiplegia takes excedrin. Topamax is preventative. He has titrated down to 50mg  at bedtime. He is more mindful of sleep. He doesn't want to be on topamax. He has modified lifestyle. Can go down to 25 and then stop it in a few weeks. Last headache was a month ago, he didn't sleep well. It wasn't bad, has not. Rizatriptan. Technically he doesn't have to take asa, discussed pros and cons. Call him for 6 month f/u.   Patient complains of symptoms per HPI as well as the following symptoms: none . Pertinent negatives and positives per HPI.  All others negative   Today 05/10/2022: Saw neurologist in [place], MRI cervical spine was unremarkable, EMG for left arm was unremarkable. Had left arm weakness, noticed hard to pick up dumbbells, strength was not equal with both arms. He was having extended work commute 5 hours, he modified this, it resolved. He did reduce the Topamax to 50 mg at the same time. He did go back to 75 mg shortly after symptoms. He remains on aspirin. Headaches are primarily triggered by sleep position. 1-2 times a month needs Excedrin. He is doing well. He would like to reduce Topamax, see about stopping aspirin.  04/12/21 SS: Garrett Andrews here today for follow-up with his wife and 5 year old son. Doing well on Topamax 75 mg at bedtime. Takes Excedrin migraine for acute headache. Having 1-2 headaches a month. Respond well to Excedrin. No further headaches with stroke like symptoms. Remains on aspirin 81 mg daily. Has chronic sinus issues. They just moved to [facility]. May consider establishing with a new neurologist in their area.   Update 08/10/20 SS: Garrett Andrews is a 42 year old male with history of migraine headaches since he was a child.  In December 2021 and January 2022, had migraine with strokelike symptoms.  This was the first time this had occurred.  Symptoms were on the right side.  He eventually had MRI of the brain that was normal.  He was started on Topamax after initial consult with Dr. Anne Hahn in January, worked up to 75 mg at bedtime.  He  has had no further migraines with strokelike symptoms.  Before the Topamax, on average having 1 migraine a week.  Since January, has only had 3 migraines.  He tolerates the Topamax well, only mild tingling side effects.  He continues to take Excedrin Migraine for 2 of the 3 headaches with good benefit.  He remains on aspirin 81 mg daily.  He identifies his triggers as neck pain from uncomfortable sleep position, delay in eating breakfast or lunch.  He lives with his wife, they have  an almost 29-year-old son, he is doing well after being born premature at 56 weeks.  Here today for evaluation unaccompanied.  HISTORY 05/12/2020 Dr. Anne Hahn: Garrett Andrews is a 42 year old right-handed male with a history of migraine headaches since he was a child.  His headaches generally are in the fronto-temporal regions bilaterally associated with some photophobia and phonophobia and some nausea and vomiting.  He has noted that if he treats the headache quickly with Excedrin Migraine he is able to abort the headache on most occasions.  He typically gets 1-2 headaches a month and functions fairly well.  Recently, he has had a significant increase in the number of headaches, he has had about 10 headaches over the last 1 month.  At least 2 of the headaches have been associated with strokelike symptoms.  He went to the emergency room on 04 May 2020 with onset of right arm and hand numbness and weakness, then he developed numbness in the tongue and eventually developed an aphasia syndrome.  The weakness and numbness lasted about 15 to 20 minutes but the aphasia lasted about an hour.  He went to the hospital for an evaluation, CT scan of the brain was done and was unremarkable.  The patient developed a headache after the sensory and motor symptoms resolved, he had nausea and vomiting with the headache.  He reported no visual symptoms.  On 07 May 2020, he had another episode of numbness that started in the right leg and then went to the right arm, this lasted 15 to 20 minutes and then resolved with the headache that followed.  He had nausea and vomiting with this.  The patient has been set up for an MRI of the brain that was normal.  He comes to the office today for an evaluation.  He indicates that poor posture, lack of sleep, and certain odors will bring on his headache.  He indicates that his sister and his father have a history of migraine.   REVIEW OF SYSTEMS: Out of a complete 14 system review of symptoms,  the patient complains only of the following symptoms, and all other reviewed systems are negative.  Headache  ALLERGIES: Allergies  Allergen Reactions   Chenopodium Quinoa     Quinoa rice allergy    HOME MEDICATIONS: Outpatient Medications Prior to Visit  Medication Sig Dispense Refill   aspirin 81 MG EC tablet Take 81 mg by mouth daily. Swallow whole.     aspirin-acetaminophen-caffeine (EXCEDRIN MIGRAINE) 250-250-65 MG tablet Take by mouth every 6 (six) hours as needed for headache.     topiramate (TOPAMAX) 25 MG tablet Take 3 at night 270 tablet 3   No facility-administered medications prior to visit.    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    PAST SURGICAL HISTORY: Past Surgical History:  Procedure Laterality Date   KNEE CARTILAGE SURGERY Left     FAMILY HISTORY: Family History  Problem Relation Age  of Onset   Diabetes Mother    Diabetes Father    Migraines Father    Migraines Sister     SOCIAL HISTORY: Social History   Socioeconomic History   Marital status: Married    Spouse name: Pryanka   Number of children: Not on file   Years of education: Not on file   Highest education level: Not on file  Occupational History    Comment: full time  Tobacco Use   Smoking status: Former    Current packs/day: 0.00    Types: Cigarettes    Quit date: 2005    Years since quitting: 19.5   Smokeless tobacco: Never  Substance and Sexual Activity   Alcohol use: Yes    Comment: socially   Drug use: Never   Sexual activity: Not on file  Other Topics Concern   Not on file  Social History Narrative   Lives with wife and son   Right Handed   Drinks coffee and soda several times daily   Social Determinants of Health   Financial Resource Strain: Low Risk  (08/15/2022)   Received from [facility], [facility]   Overall Financial Resource Strain (CARDIA)    Difficulty of Paying Living Expenses: Not hard  at all  Food Insecurity: No Food Insecurity (08/15/2022)   Received from [facility], [facility]   Hunger Vital Sign    Worried About Running Out of Food in the Last Year: Never true    Ran Out of Food in the Last Year: Never true  Transportation Needs: No Transportation Needs (08/15/2022)   Received from [facility], [facility]   PRAPARE - Transportation    Lack of Transportation (Medical): No    Lack of Transportation (Non-Medical): No  Physical Activity: Insufficiently Active (07/25/2021)   Received from [facility], [facility]   Exercise Vital Sign    Days of Exercise per Week: 7 days    Minutes of Exercise per Session: 20 min  Stress: Medium Risk (08/19/2019)   Received from [facility]   Stress    Stress in your Life: Not on file    Dealing with Stress: Not on file  Social Connections: Not on file  Intimate Partner Violence: Low Risk  (08/19/2019)   Received from [facility]   Intimate Partner Violence    Insults You: Not on file    Threatens You: Not on file    Screams at You: Not on file    Physically Hurt: Not on file    Intimate Partner Violence Score: Not on file   PHYSICAL EXAM  There were no vitals filed for this visit.  There is no height or weight on file to calculate BMI.  Physical exam: Exam: Gen: NAD, conversant      CV: Denies palpitations or chest pain or SOB. VS: Breathing at a normal rate. Weight appears within normal limits. Not febrile. Eyes: Conjunctivae clear without exudates or hemorrhage  Neuro: Detailed Neurologic Exam  Speech:    Speech is normal; fluent and spontaneous with normal comprehension.  Cognition:    The patient is oriented to person, place, and time;     recent and remote memory intact;     language fluent;     normal attention, concentration,     fund of knowledge Cranial Nerves:    The pupils are equal, round, and reactive to light. Visual fields are full to finger confrontation.  Extraocular movements  are intact.  The face is symmetric with normal sensation. The palate elevates in the midline. Hearing intact. Voice is normal. Shoulder shrug is normal. The tongue has normal motion without fasciculations.   Coordination:    Normal finger to nose  Gait:    Normal native gait  Motor Observation:   no involuntary movements noted. Tone:    Appears normal  Posture:    Posture is normal. normal erect    Strength:    Strength is anti-gravity and symmetric in the upper and lower limbs.      Sensation: intact to LT      DIAGNOSTIC DATA (LABS, IMAGING, TESTING) - I reviewed patient records, labs, notes, testing and imaging myself where available.  Lab Results  Component Value Date   WBC 7.8 05/04/2020   HGB 14.7 05/04/2020   HCT 44.2 05/04/2020   MCV 82.5 05/04/2020   PLT 249 05/04/2020      Component Value Date/Time   NA 139 05/04/2020 0356   K 3.9 05/04/2020 0356   CL 103 05/04/2020 0356   CO2 26 05/04/2020 0356   GLUCOSE 110 (H) 05/04/2020 0356   BUN 11 05/04/2020 0356   CREATININE 1.16 05/04/2020 0356   CALCIUM 9.6 05/04/2020 0356   PROT 6.9 05/04/2020 0356   ALBUMIN 4.4 05/04/2020 0356   AST 23 05/04/2020 0356   ALT 28 05/04/2020 0356   ALKPHOS 65 05/04/2020 0356   BILITOT 0.6 05/04/2020 0356   GFRNONAA >60 05/04/2020 0356   No results found for: "CHOL", "HDL", "LDLCALC", "LDLDIRECT", "TRIG", "CHOLHDL" No results found for: "HGBA1C" No results found for: "VITAMINB12" No results found for: "TSH"  .med ASSESSMENT AND PLAN 42 y.o. 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 here with:  1.  Hemiplegic migraine, chronic migraine headaches  -STill Doing very well, would like to try to reduce Topamax, currently on 75 mg daily, will reduce to 50 mg for 6 weeks, continue to reduce to 25 for several weeks if he does well, monitor symptoms -Will remain on aspirin 81 mg daily due to history of strokelike symptoms  associated with migraine aura, we discussed today pros and cons -He will see Dr. Lucia Gaskins in 6 months virtually to establish care : that was this visit, he can see Margie Ege for follow ups - He can take maxalt acutely, there is multiple research that triptans are safe in hemiplegic migraines, explained risks and In the past we would not give but most recent literature is comforting that triptans are fine in this type of migraine  Meds ordered this encounter  Medications   rizatriptan (MAXALT-MLT) 10 MG disintegrating tablet    Sig: Take 1 tablet (10 mg total) by mouth as needed for migraine. May repeat in 2 hours if needed    Dispense:  9 tablet    Refill:  [address] [place], [place] 11/18/2022, 1:29 PM [facility] [address], [address] [place], [place] [address] [phone]

## 2022-11-14 NOTE — Telephone Encounter (Signed)
Called pt and asked him to log off and log back on at 10:00am per Dr. Lucia Gaskins. Pt stated that was fine with him.

## 2022-11-18 ENCOUNTER — Telehealth: Payer: Self-pay | Admitting: Neurology

## 2022-11-18 ENCOUNTER — Encounter: Payer: Self-pay | Admitting: Neurology

## 2022-11-18 NOTE — Telephone Encounter (Signed)
Please call for 6 month f/u with sarah slack can be video thanks

## 2022-11-18 NOTE — Patient Instructions (Signed)
Continue current plan.

## 2022-11-19 NOTE — Telephone Encounter (Signed)
VV scheduled for 05/22/23 at 1:30 with Maralyn Sago.

## 2023-05-21 ENCOUNTER — Telehealth: Payer: Self-pay | Admitting: Neurology

## 2023-05-21 NOTE — Telephone Encounter (Addendum)
 Message sent in error, phone rep sent to POD in error.  This is just a documentation that pt called to cx appointment due to a conflict.

## 2023-05-22 ENCOUNTER — Telehealth: Payer: BC Managed Care – PPO | Admitting: Neurology

## 2023-07-24 ENCOUNTER — Other Ambulatory Visit: Payer: Self-pay | Admitting: Neurology

## 2023-07-24 ENCOUNTER — Encounter: Payer: Self-pay | Admitting: Neurology

## 2023-07-25 MED ORDER — TOPIRAMATE 25 MG PO TABS: ORAL_TABLET | ORAL | 3 refills | Status: DC

## 2024-06-11 NOTE — Progress Notes (Unsigned)
 "  PATIENT: Garrett Andrews DOB: 07-21-1980  REASON FOR VISIT: follow up HISTORY FROM: patient Primary Neurologist: [name]  HISTORY OF PRESENT ILLNESS: Today 06/12/24 06/12/24 SS: He has reduced Topamax  down to 25 mg at bedtime. Rare headache, only if he didn't sleep well or if neck is aching. Sees chiropractor. Has not needed Maxalt . Remains on aspirin 81 mg daily. Few years ago had shooting pain to right V3 area, occurred 8 months later, saw dentist, no etiology found. Happened few months later, nothing dental. September 2025, shooting pain 4-5 times for few seconds, saw PCP, given high dose steroid, jaw xray. Changed eating patterns to avoid sugars, better, last few days, had a candy bar, had 1/10 minor pain. Wonders about Trigeminal Neuralgia.   11/14/22 Dr. Ines: First time seeing him, he has been seeing my colleagues [name] and Garrett Andrews Born Dr.  He has some beck pain and goes to chiropractor, told him I will add that in the AVS, only having 1 migraine a month without the hemiplegia takes excedrin. Topamax  is preventative. He has titrated down to 50mg  at bedtime. He is more mindful of sleep. He doesn't want to be on topamax . He has modified lifestyle. Can go down to 25 and then stop it in a few weeks. Last headache was a month ago, he didn't sleep well. It wasn't bad, has not. Rizatriptan . Technically he doesn't have to take asa, discussed pros and cons. Call him for 6 month f/u.   05/10/22 SS: Saw neurologist in [place], MRI cervical spine was unremarkable, EMG for left arm was unremarkable. Had left arm weakness, noticed hard to pick up dumbbells, strength was not equal with both arms. He was having extended work commute 5 hours, he modified this, it resolved. He did reduce the Topamax  to 50 mg at the same time. He did go back to 75 mg shortly after symptoms. He remains on aspirin. Headaches are primarily triggered by sleep position. 1-2 times a month needs Excedrin. He is doing  well. He would like to reduce Topamax , see about stopping aspirin.  04/12/21 SS: [name] here today for follow-up with his wife and 55 year old son. Doing well on Topamax  75 mg at bedtime. Takes Excedrin migraine for acute headache. Having 1-2 headaches a month. Respond well to Excedrin. No further headaches with stroke like symptoms. Remains on aspirin 81 mg daily. Has chronic sinus issues. They just moved to [place] [place] [facility]. May consider establishing with a new neurologist in their area.   Update 08/10/20 SS: [name] is a 43 year old male with history of migraine headaches since he was a child.  In December 2021 and January 2022, had migraine with strokelike symptoms.  This was the first time this had occurred.  Symptoms were on the right side.  He eventually had MRI of the brain that was normal.  He was started on Topamax  after initial consult with [name] in January, worked up to 75 mg at bedtime.  He has had no further migraines with strokelike symptoms.  Before the Topamax , on average having 1 migraine a week.  Since January, has only had 3 migraines.  He tolerates the Topamax  well, only mild tingling side effects.  He continues to take Excedrin Migraine for 2 of the 3 headaches with good benefit.  He remains on aspirin 81 mg daily.  He identifies his triggers as neck pain from uncomfortable sleep position, delay in eating breakfast or lunch.  He lives with his wife, they have  an almost 66-year-old son, he is doing well after being born premature at 44 weeks.  Here today for evaluation unaccompanied.  HISTORY 05/12/2020 [name]: [name] is a 44 year old right-handed male with a history of migraine headaches since he was a child.  His headaches generally are in the fronto-temporal regions bilaterally associated with some photophobia and phonophobia and some nausea and vomiting.  He has noted that if he treats the headache quickly with Excedrin Migraine he is able to abort the headache on most  occasions.  He typically gets 1-2 headaches a month and functions fairly well.  Recently, he has had a significant increase in the number of headaches, he has had about 10 headaches over the last 1 month.  At least 2 of the headaches have been associated with strokelike symptoms.  He went to the emergency room on 04 May 2020 with onset of right arm and hand numbness and weakness, then he developed numbness in the tongue and eventually developed an aphasia syndrome.  The weakness and numbness lasted about 15 to 20 minutes but the aphasia lasted about an hour.  He went to the hospital for an evaluation, CT scan of the brain was done and was unremarkable.  The patient developed a headache after the sensory and motor symptoms resolved, he had nausea and vomiting with the headache.  He reported no visual symptoms.  On 07 May 2020, he had another episode of numbness that started in the right leg and then went to the right arm, this lasted 15 to 20 minutes and then resolved with the headache that followed.  He had nausea and vomiting with this.  The patient has been set up for an MRI of the brain that was normal.  He comes to the office today for an evaluation.  He indicates that poor posture, lack of sleep, and certain odors will bring on his headache.  He indicates that his sister and his father have a history of migraine.   REVIEW OF SYSTEMS: Out of a complete 14 system review of symptoms, the patient complains only of the following symptoms, and all other reviewed systems are negative.  Headache  ALLERGIES: Allergies  Allergen Reactions   Chenopodium Quinoa     Quinoa rice allergy    HOME MEDICATIONS: Outpatient Medications Prior to Visit  Medication Sig Dispense Refill   aspirin 81 MG EC tablet Take 81 mg by mouth daily. Swallow whole.     aspirin-acetaminophen-caffeine (EXCEDRIN MIGRAINE) 250-250-65 MG tablet Take by mouth every 6 (six) hours as needed for headache.     rizatriptan   (MAXALT -MLT) 10 MG disintegrating tablet Take 1 tablet (10 mg total) by mouth as needed for migraine. May repeat in 2 hours if needed 9 tablet 11   topiramate  (TOPAMAX ) 25 MG tablet Take 3 at night (Patient taking differently: Take 25 mg by mouth daily. Take 3 at night) 270 tablet 3   No facility-administered medications prior to visit.    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    PAST SURGICAL HISTORY: Past Surgical History:  Procedure Laterality Date   KNEE CARTILAGE SURGERY Left     FAMILY HISTORY: Family History  Problem Relation Age of Onset   Diabetes Mother    Diabetes Father    Migraines Father    Migraines Sister     SOCIAL HISTORY: Social History   Socioeconomic History   Marital status: Married    Spouse name: Pryanka  Number of children: Not on file   Years of education: Not on file   Highest education level: Not on file  Occupational History    Comment: full time  Tobacco Use   Smoking status: Former    Current packs/day: 0.00    Types: Cigarettes    Quit date: 2005    Years since quitting: 21.1   Smokeless tobacco: Never  Substance and Sexual Activity   Alcohol use: Yes    Comment: socially   Drug use: Never   Sexual activity: Not on file  Other Topics Concern   Not on file  Social History Narrative   Lives with wife and son   Right Handed   Drinks coffee and soda several times daily   Social Drivers of Health   Tobacco Use: Medium Risk (06/12/2024)   Patient History    Smoking Tobacco Use: Former    Smokeless Tobacco Use: Never    Passive Exposure: Not on file  Financial Resource Strain: Low Risk (01/14/2023)   Received from [facility]   Overall Financial Resource Strain (CARDIA)    Difficulty of Paying Living Expenses: Not hard at all  Food Insecurity: No Food Insecurity (10/09/2023)   [facility]   Epic    Within the past 12 months, you worried that your food  would run out before you got the money to buy more.: Never true    Within the past 12 months, the food you bought just didn't last and you didn't have money to get more.: Never true  Transportation Needs: No Transportation Needs (10/09/2023)   Received from [facility]   PRAPARE - Transportation    Lack of Transportation (Medical): No    Lack of Transportation (Non-Medical): No  Physical Activity: Insufficiently Active (07/25/2021)   Received from [facility]   Exercise Vital Sign    On average, how many days per week do you engage in moderate to strenuous exercise (like a brisk walk)?: 7 days    On average, how many minutes do you engage in exercise at this level?: 20 min  Stress: Not on file  Social Connections: Not on file  Intimate Partner Violence: Not on file  Depression (EYV7-0): Not on file  Alcohol Screen: Not on file  Housing: Not on file  Utilities: Low Risk (10/09/2023)   Received from [facility]   Utilities    Within the past 12 months, have you been unable to get utilities(heat, electricity) when it was really needed?: No  Health Literacy: Low Risk (07/25/2021)   Received from [facility] Literacy    How often do you need to have someone help you when you read instructions, pamphlets, or other written material from your doctor or pharmacy?: Never   PHYSICAL EXAM  Vitals:   06/12/24 0953 06/12/24 0958  BP: (!) 144/91 137/89  Pulse: 66   SpO2: 99%   Weight: 200 lb 8 oz (90.9 kg)   Height: 6' 1 (1.854 m)     Body mass index is 26.45 kg/m.  Generalized: Well developed, in no acute distress   Neurological examination  Mentation: Alert oriented to time, place, history taking. Follows all commands speech and language fluent Cranial nerve II-XII: Pupils were equal round reactive to light. Extraocular movements were full, visual field were full on confrontational test. Facial sensation and strength were normal. Head turning and shoulder shrug   were normal and symmetric. Motor: The motor  testing reveals 5 over 5 strength of all 4 extremities. Good symmetric motor tone is noted throughout.  Sensory: Sensory testing is intact to soft touch on all 4 extremities. No evidence of extinction is noted.  Coordination: Cerebellar testing reveals good finger-nose-finger and heel-to-shin bilaterally.  Gait and station: Gait is normal.  Reflexes: Deep tendon reflexes are symmetric and normal bilaterally.   DIAGNOSTIC DATA (LABS, IMAGING, TESTING) - I reviewed patient records, labs, notes, testing and imaging myself where available.  Lab Results  Component Value Date   WBC 7.8 05/04/2020   HGB 14.7 05/04/2020   HCT 44.2 05/04/2020   MCV 82.5 05/04/2020   PLT 249 05/04/2020      Component Value Date/Time   NA 139 05/04/2020 0356   K 3.9 05/04/2020 0356   CL 103 05/04/2020 0356   CO2 26 05/04/2020 0356   GLUCOSE 110 (H) 05/04/2020 0356   BUN 11 05/04/2020 0356   CREATININE 1.16 05/04/2020 0356   CALCIUM 9.6 05/04/2020 0356   PROT 6.9 05/04/2020 0356   ALBUMIN 4.4 05/04/2020 0356   AST 23 05/04/2020 0356   ALT 28 05/04/2020 0356   ALKPHOS 65 05/04/2020 0356   BILITOT 0.6 05/04/2020 0356   GFRNONAA >60 05/04/2020 0356   No results found for: CHOL, HDL, LDLCALC, LDLDIRECT, TRIG, CHOLHDL No results found for: YHAJ8R No results found for: VITAMINB12 No results found for: TSH  ASSESSMENT AND PLAN 44 y.o. 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 here with:  1.  Hemiplegic migraine, chronic migraine headaches  2.  Right sided facial pain, possible trigeminal neuralgia  - Check MRI of the brain with and without contrast, MRI trigeminal/facial - Continue Topamax  25 mg at bedtime, we may discontinue altogether in the future since headaches under excellent control  - Continue Maxalt  10 mg as needed for acute headache - Has remained on aspirin 81 mg daily due to history of  strokelike symptoms with migraine with aura - Schedule follow-up 6 months with Dr. Gregg to establish care, if MRI imaging/facial pain is under good control may push out to 1 year   Orders Placed This Encounter  Procedures   MR BRAIN W WO CONTRAST   MR FACE/TRIGEMINAL WO/W CM   Garrett Andrews Born, AGNP-C, DNP 06/12/2024, 10:05 AM [facility] [address] [place], [place] [address] [phone]  "

## 2024-06-12 ENCOUNTER — Ambulatory Visit: Admitting: Neurology

## 2024-06-12 ENCOUNTER — Encounter: Payer: Self-pay | Admitting: Neurology

## 2024-06-12 VITALS — BP 137/89 | HR 66 | Ht 73.0 in | Wt 200.5 lb

## 2024-06-12 DIAGNOSIS — G43009 Migraine without aura, not intractable, without status migrainosus: Secondary | ICD-10-CM

## 2024-06-12 DIAGNOSIS — G43419 Hemiplegic migraine, intractable, without status migrainosus: Secondary | ICD-10-CM

## 2024-06-12 DIAGNOSIS — G510 Bell's palsy: Secondary | ICD-10-CM

## 2024-06-12 DIAGNOSIS — R519 Headache, unspecified: Secondary | ICD-10-CM | POA: Insufficient documentation

## 2024-06-12 MED ORDER — RIZATRIPTAN BENZOATE 10 MG PO TBDP: 10.0000 mg | ORAL_TABLET | ORAL | 11 refills | Status: AC | PRN

## 2024-06-12 MED ORDER — TOPIRAMATE 25 MG PO TABS: 25.0000 mg | ORAL_TABLET | Freq: Every day | ORAL | 3 refills | Status: AC

## 2024-06-12 NOTE — Patient Instructions (Signed)
 Great to see you today! Check MRI imaging for evaluation of facial pain Continue Topamax  low-dose for now Call for worsening symptoms Plan to follow-up in 6 months.  Thanks!!

## 2024-12-01 ENCOUNTER — Ambulatory Visit: Admitting: Neurology

## 2024-12-31 ENCOUNTER — Ambulatory Visit: Admitting: Neurology
